# Patient Record
Sex: Male | Born: 1990 | Hispanic: Yes | Marital: Single | Smoking: Never smoker
Health system: Southern US, Community
[De-identification: ages and names within clinical notes are randomized; demographics above are authoritative.]

---

## 2013-02-15 ENCOUNTER — Emergency Department: Payer: Self-pay | Admitting: Emergency Medicine

## 2014-04-15 ENCOUNTER — Emergency Department: Payer: Self-pay | Admitting: Emergency Medicine

## 2019-04-18 ENCOUNTER — Emergency Department
Admission: EM | Admit: 2019-04-18 | Discharge: 2019-04-18 | Disposition: A | Discharge status: Home / Self Care | Payer: No Typology Code available for payment source | Attending: Emergency Medicine | Admitting: Emergency Medicine

## 2019-04-18 ENCOUNTER — Encounter: Payer: Self-pay | Admitting: Emergency Medicine

## 2019-04-18 ENCOUNTER — Other Ambulatory Visit: Payer: Self-pay

## 2019-04-18 ENCOUNTER — Emergency Department: Payer: No Typology Code available for payment source

## 2019-04-18 DIAGNOSIS — R1012 Left upper quadrant pain: Secondary | ICD-10-CM | POA: Insufficient documentation

## 2019-04-18 DIAGNOSIS — R0782 Intercostal pain: Secondary | ICD-10-CM | POA: Insufficient documentation

## 2019-04-18 DIAGNOSIS — M25512 Pain in left shoulder: Secondary | ICD-10-CM | POA: Insufficient documentation

## 2019-04-18 DIAGNOSIS — M542 Cervicalgia: Secondary | ICD-10-CM | POA: Insufficient documentation

## 2019-04-18 LAB — COMPREHENSIVE METABOLIC PANEL
ALT: 18 U/L (ref 0–44)
AST: 17 U/L (ref 15–41)
Albumin: 4.4 g/dL (ref 3.5–5.0)
Alkaline Phosphatase: 69 U/L (ref 38–126)
Anion gap: 10 (ref 5–15)
BUN: 12 mg/dL (ref 6–20)
CO2: 28 mmol/L (ref 22–32)
Calcium: 9.3 mg/dL (ref 8.9–10.3)
Chloride: 101 mmol/L (ref 98–111)
Creatinine, Ser: 0.82 mg/dL (ref 0.61–1.24)
GFR calc Af Amer: >60 mL/min (ref >60)
GFR calc non Af Amer: >60 mL/min (ref >60)
Glucose, Bld: 100 mg/dL — ABNORMAL HIGH (ref 70–99)
Potassium: 3.8 mmol/L (ref 3.5–5.1)
Sodium: 139 mmol/L (ref 135–145)
Total Bilirubin: 0.5 mg/dL (ref 0.3–1.2)
Total Protein: 7.4 g/dL (ref 6.5–8.1)

## 2019-04-18 LAB — URINALYSIS, COMPLETE (UACMP) WITH MICROSCOPIC
Bacteria, UA: NONE SEEN
Bilirubin Urine: NEGATIVE
Glucose, UA: NEGATIVE mg/dL
Hgb urine dipstick: NEGATIVE
Ketones, ur: NEGATIVE mg/dL
Leukocytes,Ua: NEGATIVE
Nitrite: NEGATIVE
Protein, ur: NEGATIVE mg/dL
Specific Gravity, Urine: 1.02 (ref 1.005–1.030)
Squamous Epithelial / HPF: NONE SEEN (ref 0–5)
pH: 6 (ref 5.0–8.0)

## 2019-04-18 LAB — CBC WITH DIFFERENTIAL/PLATELET
Abs Immature Granulocytes: 0.03 10*3/uL (ref 0.00–0.07)
Basophils Absolute: 0.1 10*3/uL (ref 0.0–0.1)
Basophils Relative: 1 %
Eosinophils Absolute: 0.1 10*3/uL (ref 0.0–0.5)
Eosinophils Relative: 1 %
HCT: 44.6 % (ref 39.0–52.0)
Hemoglobin: 15 g/dL (ref 13.0–17.0)
Immature Granulocytes: 0 %
Lymphocytes Relative: 21 %
Lymphs Abs: 2 10*3/uL (ref 0.7–4.0)
MCH: 28.9 pg (ref 26.0–34.0)
MCHC: 33.6 g/dL (ref 30.0–36.0)
MCV: 85.9 fL (ref 80.0–100.0)
Monocytes Absolute: 0.7 10*3/uL (ref 0.1–1.0)
Monocytes Relative: 8 %
Neutro Abs: 6.7 10*3/uL (ref 1.7–7.7)
Neutrophils Relative %: 69 %
Platelets: 291 10*3/uL (ref 150–400)
RBC: 5.19 MIL/uL (ref 4.22–5.81)
RDW: 13 % (ref 11.5–15.5)
WBC: 9.7 10*3/uL (ref 4.0–10.5)
nRBC: 0 % (ref 0.0–0.2)

## 2019-04-18 MED ORDER — IOHEXOL 300 MG/ML  SOLN
100.0000 mL | Freq: Once | INTRAMUSCULAR | Status: AC | PRN
  Administered 2019-04-18: 100 mL via INTRAVENOUS
  Filled 2019-04-18: qty 100

## 2019-04-18 MED ORDER — MELOXICAM 15 MG PO TABS: 15.0000 mg | ORAL_TABLET | Freq: Every day | ORAL | 1 refills | Status: DC

## 2019-04-18 MED ORDER — METHOCARBAMOL 500 MG PO TABS: 500.0000 mg | ORAL_TABLET | Freq: Three times a day (TID) | ORAL | 0 refills | Status: DC | PRN
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
## 2019-04-22 ENCOUNTER — Encounter
Admission: EM | Disposition: A | Discharge status: Home / Self Care | Payer: Self-pay | Source: Home / Self Care | Attending: Emergency Medicine

## 2019-04-22 ENCOUNTER — Observation Stay
Admission: EM | Admit: 2019-04-22 | Discharge: 2019-04-24 | Disposition: A | Discharge status: Home / Self Care | Attending: Surgery | Admitting: Surgery

## 2019-04-22 ENCOUNTER — Observation Stay: Admitting: Anesthesiology

## 2019-04-22 ENCOUNTER — Emergency Department

## 2019-04-22 ENCOUNTER — Encounter: Payer: Self-pay | Admitting: Radiology

## 2019-04-22 ENCOUNTER — Other Ambulatory Visit: Payer: Self-pay

## 2019-04-22 DIAGNOSIS — Z20828 Contact with and (suspected) exposure to other viral communicable diseases: Secondary | ICD-10-CM | POA: Insufficient documentation

## 2019-04-22 DIAGNOSIS — G4730 Sleep apnea, unspecified: Secondary | ICD-10-CM | POA: Diagnosis not present

## 2019-04-22 DIAGNOSIS — K3580 Unspecified acute appendicitis: Secondary | ICD-10-CM | POA: Diagnosis not present

## 2019-04-22 HISTORY — PX: LAPAROSCOPIC APPENDECTOMY: SHX408

## 2019-04-22 LAB — COMPREHENSIVE METABOLIC PANEL
ALT: 21 U/L (ref 0–44)
AST: 23 U/L (ref 15–41)
Albumin: 4.7 g/dL (ref 3.5–5.0)
Alkaline Phosphatase: 82 U/L (ref 38–126)
Anion gap: 13 (ref 5–15)
BUN: 11 mg/dL (ref 6–20)
CO2: 22 mmol/L (ref 22–32)
Calcium: 9.4 mg/dL (ref 8.9–10.3)
Chloride: 101 mmol/L (ref 98–111)
Creatinine, Ser: 0.82 mg/dL (ref 0.61–1.24)
GFR calc Af Amer: >60 mL/min (ref >60)
GFR calc non Af Amer: >60 mL/min (ref >60)
Glucose, Bld: 121 mg/dL — ABNORMAL HIGH (ref 70–99)
Potassium: 3.5 mmol/L (ref 3.5–5.1)
Sodium: 136 mmol/L (ref 135–145)
Total Bilirubin: 1.3 mg/dL — ABNORMAL HIGH (ref 0.3–1.2)
Total Protein: 7.8 g/dL (ref 6.5–8.1)

## 2019-04-22 LAB — SARS CORONAVIRUS 2 BY RT PCR (HOSPITAL ORDER, PERFORMED IN ~~LOC~~ HOSPITAL LAB): SARS Coronavirus 2: NEGATIVE

## 2019-04-22 LAB — CBC
HCT: 46 % (ref 39.0–52.0)
Hemoglobin: 15.5 g/dL (ref 13.0–17.0)
MCH: 28.5 pg (ref 26.0–34.0)
MCHC: 33.7 g/dL (ref 30.0–36.0)
MCV: 84.6 fL (ref 80.0–100.0)
Platelets: 292 10*3/uL (ref 150–400)
RBC: 5.44 MIL/uL (ref 4.22–5.81)
RDW: 12.9 % (ref 11.5–15.5)
WBC: 24 10*3/uL — ABNORMAL HIGH (ref 4.0–10.5)
nRBC: 0 % (ref 0.0–0.2)

## 2019-04-22 LAB — URINALYSIS, COMPLETE (UACMP) WITH MICROSCOPIC
Bacteria, UA: NONE SEEN
Bilirubin Urine: NEGATIVE
Glucose, UA: NEGATIVE mg/dL
Hgb urine dipstick: NEGATIVE
Ketones, ur: 5 mg/dL — AB
Leukocytes,Ua: NEGATIVE
Nitrite: NEGATIVE
Protein, ur: NEGATIVE mg/dL
Specific Gravity, Urine: 1.027 (ref 1.005–1.030)
Squamous Epithelial / HPF: NONE SEEN (ref 0–5)
pH: 7 (ref 5.0–8.0)

## 2019-04-22 LAB — LIPASE, BLOOD: Lipase: 20 U/L (ref 11–51)

## 2019-04-22 LAB — SURGICAL PCR SCREEN
MRSA, PCR: NEGATIVE
Staphylococcus aureus: NEGATIVE

## 2019-04-22 SURGERY — APPENDECTOMY, LAPAROSCOPIC
Anesthesia: General | Site: Abdomen

## 2019-04-22 MED ORDER — POLYETHYLENE GLYCOL 3350 17 G PO PACK: 17.0000 g | PACK | Freq: Every day | ORAL | Status: DC | PRN

## 2019-04-22 MED ORDER — ONDANSETRON HCL 4 MG/2ML IJ SOLN: 4.0000 mg | Freq: Four times a day (QID) | INTRAMUSCULAR | Status: DC | PRN

## 2019-04-22 MED ORDER — PROPOFOL 10 MG/ML IV BOLUS
INTRAVENOUS | Status: DC | PRN
  Administered 2019-04-22: 160 mg via INTRAVENOUS
  Administered 2019-04-22: 40 mg via INTRAVENOUS

## 2019-04-22 MED ORDER — MORPHINE SULFATE (PF) 4 MG/ML IV SOLN
4.0000 mg | Freq: Once | INTRAVENOUS | Status: AC
  Administered 2019-04-22: 4 mg via INTRAVENOUS
  Filled 2019-04-22: qty 1

## 2019-04-22 MED ORDER — HYDROMORPHONE HCL 1 MG/ML IJ SOLN
INTRAMUSCULAR | Status: DC | PRN
  Administered 2019-04-22 (×2): 0.5 mg via INTRAVENOUS

## 2019-04-22 MED ORDER — ACETAMINOPHEN 10 MG/ML IV SOLN
INTRAVENOUS | Status: AC
  Filled 2019-04-22: qty 100

## 2019-04-22 MED ORDER — ENOXAPARIN SODIUM 40 MG/0.4ML ~~LOC~~ SOLN
40.0000 mg | SUBCUTANEOUS | Status: DC
  Administered 2019-04-23: 40 mg via SUBCUTANEOUS
  Filled 2019-04-22: qty 0.4

## 2019-04-22 MED ORDER — KETOROLAC TROMETHAMINE 30 MG/ML IJ SOLN
30.0000 mg | Freq: Four times a day (QID) | INTRAMUSCULAR | Status: DC
  Administered 2019-04-22 – 2019-04-24 (×6): 30 mg via INTRAVENOUS
  Filled 2019-04-22 (×6): qty 1

## 2019-04-22 MED ORDER — OXYCODONE HCL 5 MG PO TABS: 5.0000 mg | ORAL_TABLET | Freq: Once | ORAL | Status: DC | PRN

## 2019-04-22 MED ORDER — LIDOCAINE HCL (CARDIAC) PF 100 MG/5ML IV SOSY
PREFILLED_SYRINGE | INTRAVENOUS | Status: DC | PRN
  Administered 2019-04-22: 100 mg via INTRAVENOUS

## 2019-04-22 MED ORDER — ONDANSETRON 4 MG PO TBDP
4.0000 mg | ORAL_TABLET | Freq: Once | ORAL | Status: AC | PRN
  Administered 2019-04-22: 4 mg via ORAL
  Filled 2019-04-22: qty 1

## 2019-04-22 MED ORDER — SODIUM CHLORIDE 0.9 % IV BOLUS
1000.0000 mL | Freq: Once | INTRAVENOUS | Status: AC
  Administered 2019-04-22: 1000 mL via INTRAVENOUS

## 2019-04-22 MED ORDER — IOHEXOL 300 MG/ML  SOLN
100.0000 mL | Freq: Once | INTRAMUSCULAR | Status: AC | PRN
  Administered 2019-04-22: 100 mL via INTRAVENOUS

## 2019-04-22 MED ORDER — PANTOPRAZOLE SODIUM 40 MG IV SOLR
40.0000 mg | Freq: Every day | INTRAVENOUS | Status: DC
  Administered 2019-04-22 – 2019-04-23 (×2): 40 mg via INTRAVENOUS
  Filled 2019-04-22 (×2): qty 40

## 2019-04-22 MED ORDER — ROCURONIUM BROMIDE 100 MG/10ML IV SOLN
INTRAVENOUS | Status: DC | PRN
  Administered 2019-04-22: 40 mg via INTRAVENOUS
  Administered 2019-04-22: 10 mg via INTRAVENOUS

## 2019-04-22 MED ORDER — OXYCODONE HCL 5 MG/5ML PO SOLN: 5.0000 mg | Freq: Once | ORAL | Status: DC | PRN

## 2019-04-22 MED ORDER — SUCCINYLCHOLINE CHLORIDE 20 MG/ML IJ SOLN
INTRAMUSCULAR | Status: DC | PRN
  Administered 2019-04-22: 100 mg via INTRAVENOUS

## 2019-04-22 MED ORDER — BUPIVACAINE-EPINEPHRINE (PF) 0.5% -1:200000 IJ SOLN
INTRAMUSCULAR | Status: AC
  Filled 2019-04-22: qty 30

## 2019-04-22 MED ORDER — SUGAMMADEX SODIUM 500 MG/5ML IV SOLN
INTRAVENOUS | Status: DC | PRN
  Administered 2019-04-22: 200 mg via INTRAVENOUS

## 2019-04-22 MED ORDER — MUPIROCIN 2 % EX OINT
1.0000 "application " | TOPICAL_OINTMENT | Freq: Two times a day (BID) | CUTANEOUS | Status: DC
  Filled 2019-04-22: qty 22

## 2019-04-22 MED ORDER — LACTATED RINGERS IV SOLN
INTRAVENOUS | Status: DC | PRN
  Administered 2019-04-22: 21:00:00 via INTRAVENOUS

## 2019-04-22 MED ORDER — FENTANYL CITRATE (PF) 100 MCG/2ML IJ SOLN
INTRAMUSCULAR | Status: AC
  Filled 2019-04-22: qty 2

## 2019-04-22 MED ORDER — HYDROMORPHONE HCL 1 MG/ML IJ SOLN: 0.5000 mg | INTRAMUSCULAR | Status: DC | PRN

## 2019-04-22 MED ORDER — FENTANYL CITRATE (PF) 100 MCG/2ML IJ SOLN
INTRAMUSCULAR | Status: DC | PRN
  Administered 2019-04-22: 100 ug via INTRAVENOUS

## 2019-04-22 MED ORDER — PROPOFOL 10 MG/ML IV BOLUS
INTRAVENOUS | Status: AC
  Filled 2019-04-22: qty 20

## 2019-04-22 MED ORDER — FENTANYL CITRATE (PF) 100 MCG/2ML IJ SOLN: 25.0000 ug | INTRAMUSCULAR | Status: DC | PRN

## 2019-04-22 MED ORDER — HYDROMORPHONE HCL 1 MG/ML IJ SOLN
INTRAMUSCULAR | Status: AC
  Filled 2019-04-22: qty 1

## 2019-04-22 MED ORDER — ONDANSETRON 4 MG PO TBDP: 4.0000 mg | ORAL_TABLET | Freq: Four times a day (QID) | ORAL | Status: DC | PRN

## 2019-04-22 MED ORDER — BUPIVACAINE-EPINEPHRINE (PF) 0.5% -1:200000 IJ SOLN
INTRAMUSCULAR | Status: DC | PRN
  Administered 2019-04-22: 30 mL

## 2019-04-22 MED ORDER — ONDANSETRON HCL 4 MG/2ML IJ SOLN
4.0000 mg | Freq: Once | INTRAMUSCULAR | Status: AC
  Administered 2019-04-22: 4 mg via INTRAVENOUS
  Filled 2019-04-22: qty 2

## 2019-04-22 MED ORDER — ACETAMINOPHEN 500 MG PO TABS: 1000.0000 mg | ORAL_TABLET | Freq: Four times a day (QID) | ORAL | Status: DC | PRN

## 2019-04-22 MED ORDER — LACTATED RINGERS IV SOLN
125.0000 mL/h | INTRAVENOUS | Status: DC
  Administered 2019-04-22 – 2019-04-23 (×3): 125 mL/h via INTRAVENOUS

## 2019-04-22 MED ORDER — ONDANSETRON HCL 4 MG/2ML IJ SOLN
INTRAMUSCULAR | Status: DC | PRN
  Administered 2019-04-22: 4 mg via INTRAVENOUS

## 2019-04-22 MED ORDER — ACETAMINOPHEN 10 MG/ML IV SOLN
INTRAVENOUS | Status: DC | PRN
  Administered 2019-04-22: 1000 mg via INTRAVENOUS

## 2019-04-22 MED ORDER — OXYCODONE HCL 5 MG PO TABS
5.0000 mg | ORAL_TABLET | ORAL | Status: DC | PRN
  Administered 2019-04-23: 5 mg via ORAL
  Filled 2019-04-22: qty 1

## 2019-04-22 MED ORDER — PIPERACILLIN-TAZOBACTAM 3.375 G IVPB
3.3750 g | Freq: Three times a day (TID) | INTRAVENOUS | Status: DC
  Administered 2019-04-22 – 2019-04-24 (×5): 3.375 g via INTRAVENOUS
  Filled 2019-04-22 (×5): qty 50

## 2019-04-22 MED ORDER — MIDAZOLAM HCL 2 MG/2ML IJ SOLN
INTRAMUSCULAR | Status: AC
  Filled 2019-04-22: qty 2

## 2019-04-22 SURGICAL SUPPLY — 45 items
CANISTER SUCT 1200ML W/VALVE (MISCELLANEOUS) ×3 IMPLANT
CHLORAPREP W/TINT 26 (MISCELLANEOUS) ×3 IMPLANT
COVER WAND RF STERILE (DRAPES) ×3 IMPLANT
CUTTER FLEX LINEAR 45M (STAPLE) ×2 IMPLANT
DERMABOND ADVANCED (GAUZE/BANDAGES/DRESSINGS) ×2
DERMABOND ADVANCED .7 DNX12 (GAUZE/BANDAGES/DRESSINGS) ×1 IMPLANT
ELECT CAUTERY BLADE 6.4 (BLADE) ×3 IMPLANT
ELECT REM PT RETURN 9FT ADLT (ELECTROSURGICAL) ×3
ELECTRODE REM PT RTRN 9FT ADLT (ELECTROSURGICAL) ×1 IMPLANT
GLOVE BIO SURGEON STRL SZ7 (GLOVE) ×4 IMPLANT
GLOVE BIO SURGEON STRL SZ7.5 (GLOVE) ×2 IMPLANT
GLOVE INDICATOR 7.5 STRL GRN (GLOVE) ×2 IMPLANT
GLOVE SURG SYN 7.0 (GLOVE) ×3 IMPLANT
GLOVE SURG SYN 7.0 PF PI (GLOVE) ×1 IMPLANT
GLOVE SURG SYN 7.5  E (GLOVE) ×2
GLOVE SURG SYN 7.5 E (GLOVE) ×1 IMPLANT
GLOVE SURG SYN 7.5 PF PI (GLOVE) ×1 IMPLANT
GOWN STRL REUS W/ TWL LRG LVL3 (GOWN DISPOSABLE) ×2 IMPLANT
GOWN STRL REUS W/TWL LRG LVL3 (GOWN DISPOSABLE) ×4
IRRIGATION STRYKERFLOW (MISCELLANEOUS) IMPLANT
IRRIGATOR STRYKERFLOW (MISCELLANEOUS) ×3
IV NS 1000ML (IV SOLUTION) ×2
IV NS 1000ML BAXH (IV SOLUTION) ×1 IMPLANT
KIT TURNOVER KIT A (KITS) ×3 IMPLANT
LABEL OR SOLS (LABEL) ×3 IMPLANT
LIGASURE LAP MARYLAND 5MM 37CM (ELECTROSURGICAL) ×2 IMPLANT
NEEDLE HYPO 22GX1.5 SAFETY (NEEDLE) ×3 IMPLANT
NS IRRIG 500ML POUR BTL (IV SOLUTION) ×3 IMPLANT
PACK LAP CHOLECYSTECTOMY (MISCELLANEOUS) ×3 IMPLANT
PENCIL ELECTRO HAND CTR (MISCELLANEOUS) ×3 IMPLANT
POUCH SPECIMEN RETRIEVAL 10MM (ENDOMECHANICALS) ×3 IMPLANT
RELOAD 45 VASCULAR/THIN (ENDOMECHANICALS) IMPLANT
RELOAD STAPLE 45 2.5 WHT GRN (ENDOMECHANICALS) IMPLANT
RELOAD STAPLE 45 3.5 BLU ETS (ENDOMECHANICALS) ×1 IMPLANT
RELOAD STAPLE TA45 3.5 REG BLU (ENDOMECHANICALS) ×6 IMPLANT
SCISSORS METZENBAUM CVD 33 (INSTRUMENTS) ×3 IMPLANT
SLEEVE ADV FIXATION 5X100MM (TROCAR) ×6 IMPLANT
SUT MNCRL 4-0 (SUTURE) ×2
SUT MNCRL 4-0 27XMFL (SUTURE) ×1
SUT VICRYL 0 AB UR-6 (SUTURE) ×3 IMPLANT
SUTURE MNCRL 4-0 27XMF (SUTURE) ×1 IMPLANT
TRAY FOLEY MTR SLVR 16FR STAT (SET/KITS/TRAYS/PACK) ×3 IMPLANT
TROCAR BALLN GELPORT 12X130M (ENDOMECHANICALS) ×3 IMPLANT
TROCAR Z-THREAD OPTICAL 5X100M (TROCAR) ×3 IMPLANT
TUBING EVAC SMOKE HEATED PNEUM (TUBING) ×3 IMPLANT

## 2019-04-22 NOTE — Op Note (Signed)
  Procedure Date:  04/22/2019  Pre-operative Diagnosis:  Acute appendicitis  Post-operative Diagnosis:  Acute appendicitis  Procedure:  Laparoscopic appendectomy  Surgeon:  Melvyn Neth, MD  Anesthesia:  General endotracheal  Estimated Blood Loss:  15 ml  Specimens:  appendix  Complications:  None  Indications for Procedure:  This is a 28 y.o. male who presents with abdominal pain and workup revealing acute appendicitis.  The options of surgery versus observation were reviewed with the patient and/or family. The risks of bleeding, infection, recurrence of symptoms, negative laparoscopy, potential for an open procedure, bowel injury, abscess or infection, were all discussed with the patient and he was willing to proceed.  Description of Procedure: The patient was correctly identified in the preoperative area and brought into the operating room.  The patient was placed supine with VTE prophylaxis in place.  Appropriate time-outs were performed.  Anesthesia was induced and the patient was intubated.  Foley catheter was placed.  Appropriate antibiotics were infused.  The abdomen was prepped and draped in a sterile fashion. An infraumbilical incision was made. A cutdown technique was used to enter the abdominal cavity without injury, and a Hasson trocar was inserted.  Pneumoperitoneum was obtained with appropriate opening pressures.  Two 5-mm ports were placed in the suprapubic and left lateral positions under direct visualization.  The right lower quadrant was inspected and the appendix was identified and found to be acutely inflamed.  The appendix was carefully dissected.  The mesoappendix was divided using the LigaSure.  The base of the appendix was dissected out and divided with a standard load Endo GIA.  The appendix was placed in an Endocatch bag.  There was oozing from the staple line which was controlled with cautery.  The right lower quadrant was then inspected again revealing an  intact staple line, no bleeding, and no bowel injury.  The area was thoroughly irrigated.  The 5 mm ports were removed under direct visualization and the Hasson trocar was removed.  The Endocatch bag was brought out through the umbilical incision.  The fascial opening was closed using 0 vicryl suture.  Local anesthetic was infused in all incisions and the incisions were closed with 4-0 Monocryl.  The wounds were cleaned and sealed with DermaBond.  Foley catheter was removed and the patient was emerged from anesthesia and extubated and brought to the recovery room for further management.  The patient tolerated the procedure well and all counts were correct at the end of the case.   Melvyn Neth, MD

## 2019-04-22 NOTE — Transfer of Care (Signed)
Immediate Anesthesia Transfer of Care Note  Patient: Pace Lamadrid  Procedure(s) Performed: APPENDECTOMY LAPAROSCOPIC (N/A Abdomen)  Patient Location: PACU  Anesthesia Type:General  Level of Consciousness: awake, alert  and oriented  Airway & Oxygen Therapy: Patient Spontanous Breathing  Post-op Assessment: Report given to RN  Post vital signs: Reviewed and stable  Last Vitals:  Vitals Value Taken Time  BP 139/87 04/22/19 2234  Temp 37 C 04/22/19 2234  Pulse 116 04/22/19 2234  Resp 20 04/22/19 2234  SpO2 99 % 04/22/19 2234  Vitals shown include unvalidated device data.  Last Pain:  Vitals:   04/22/19 2005  TempSrc: Oral  PainSc:          Complications: No apparent anesthesia complications
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
## 2019-04-22 NOTE — Anesthesia Post-op Follow-up Note (Signed)
Anesthesia QCDR form completed.        

## 2019-04-22 NOTE — Anesthesia Preprocedure Evaluation (Signed)
Anesthesia Evaluation  Patient identified by MRN, date of birth, ID band Patient awake    Reviewed: Allergy & Precautions, H&P , NPO status , Patient's Chart, lab work & pertinent test results  History of Anesthesia Complications Negative for: history of anesthetic complications  Airway Mallampati: III  TM Distance: <3 FB Neck ROM: full    Dental  (+) Chipped, Poor Dentition   Pulmonary neg shortness of breath, sleep apnea ,           Cardiovascular (-) angina(-) Past MI and (-) DOE negative cardio ROS       Neuro/Psych negative neurological ROS  negative psych ROS   GI/Hepatic negative GI ROS, Neg liver ROS, neg GERD  ,  Endo/Other  negative endocrine ROS  Renal/GU      Musculoskeletal   Abdominal   Peds  Hematology negative hematology ROS (+)   Anesthesia Other Findings  BMI    Body Mass Index: 27.41 kg/m      Reproductive/Obstetrics negative OB ROS                             Anesthesia Physical Anesthesia Plan  ASA: III  Anesthesia Plan: General ETT   Post-op Pain Management:    Induction: Intravenous  PONV Risk Score and Plan: Ondansetron, Dexamethasone, Midazolam and Treatment may vary due to age or medical condition  Airway Management Planned: Oral ETT  Additional Equipment:   Intra-op Plan:   Post-operative Plan: Extubation in OR  Informed Consent: I have reviewed the patients History and Physical, chart, labs and discussed the procedure including the risks, benefits and alternatives for the proposed anesthesia with the patient or authorized representative who has indicated his/her understanding and acceptance.     Dental Advisory Given  Plan Discussed with: Anesthesiologist, CRNA and Surgeon  Anesthesia Plan Comments: (Patient consented for risks of anesthesia including but not limited to:  - adverse reactions to medications - damage to teeth, lips or  other oral mucosa - sore throat or hoarseness - Damage to heart, brain, lungs or loss of life  Patient voiced understanding.)        Anesthesia Quick Evaluation

## 2019-04-22 NOTE — ED Triage Notes (Signed)
Pt reports abd pain that started at noon today with vomiting, pt has bilat lower abd pain and reports pain is a little worse at the rlq,.

## 2019-04-22 NOTE — Anesthesia Procedure Notes (Signed)
Procedure Name: Intubation Performed by: Andria Frames, MD Pre-anesthesia Checklist: Timeout performed, Patient being monitored, Suction available, Emergency Drugs available and Patient identified Patient Re-evaluated:Patient Re-evaluated prior to induction Oxygen Delivery Method: Circle system utilized Preoxygenation: Pre-oxygenation with 100% oxygen Induction Type: IV induction and Rapid sequence Laryngoscope Size: Mac and 3 Grade View: Grade I Tube type: Oral Tube size: 7.5 mm Number of attempts: 1 Placement Confirmation: breath sounds checked- equal and bilateral,  CO2 detector,  positive ETCO2 and ETT inserted through vocal cords under direct vision Secured at: 23 cm Tube secured with: Tape

## 2019-04-23 ENCOUNTER — Encounter: Payer: Self-pay | Admitting: Surgery

## 2019-04-23 LAB — CBC
HCT: 39.6 % (ref 39.0–52.0)
Hemoglobin: 13.1 g/dL (ref 13.0–17.0)
MCH: 28.5 pg (ref 26.0–34.0)
MCHC: 33.1 g/dL (ref 30.0–36.0)
MCV: 86.3 fL (ref 80.0–100.0)
Platelets: 253 10*3/uL (ref 150–400)
RBC: 4.59 MIL/uL (ref 4.22–5.81)
RDW: 13 % (ref 11.5–15.5)
WBC: 20.4 10*3/uL — ABNORMAL HIGH (ref 4.0–10.5)
nRBC: 0 % (ref 0.0–0.2)

## 2019-04-23 MED ORDER — SODIUM CHLORIDE 0.9 % IV SOLN
INTRAVENOUS | Status: DC | PRN
  Administered 2019-04-23: 250 mL via INTRAVENOUS

## 2019-04-23 NOTE — Plan of Care (Signed)
  Problem: Clinical Measurements: Goal: Postoperative complications will be avoided or minimized Outcome: Progressing   

## 2019-04-23 NOTE — Progress Notes (Addendum)
04/23/2019  Subjective: Patient is 1 Day Post-Op status post laparoscopic appendectomy.  No acute events overnight.  Patient reports that his pain is significantly better.  He has been able to tolerate clears and his diet has been advanced to a soft diet this morning.  Denies any nausea.  His white blood cell count did decrease somewhat but only to20.4.  Vital signs: Temp:  [98 F (36.7 C)-98.9 F (37.2 C)] 98.3 F (36.8 C) (09/26 0917) Pulse Rate:  [68-122] 68 (09/26 0917) Resp:  [11-22] 20 (09/26 0438) BP: (102-139)/(56-87) 117/72 (09/26 0917) SpO2:  [96 %-100 %] 100 % (09/26 0917) Weight:  [79.4 kg] 79.4 kg (09/25 1616)   Intake/Output: 09/25 0701 - 09/26 0700 In: 2503.1 [P.O.:120; I.V.:1375.7; IV Piggyback:1007.4] Out: 50 [Blood:50] Last BM Date: 04/22/19  Physical Exam: Constitutional: No acute distress Abdomen: Soft, nondistended, appropriately tender to palpation.  Incisions are clean dry and intact with no evidence of infection.  Labs:  Recent Labs    04/22/19 1618 04/23/19 0440  WBC 24.0* 20.4*  HGB 15.5 13.1  HCT 46.0 39.6  PLT 292 253   Recent Labs    04/22/19 1618  NA 136  K 3.5  CL 101  CO2 22  GLUCOSE 121*  BUN 11  CREATININE 0.82  CALCIUM 9.4   No results for input(s): LABPROT, INR in the last 72 hours.  Imaging: Ct Abdomen Pelvis W Contrast  Result Date: 04/22/2019 CLINICAL DATA:  Right lower quadrant pain EXAM: CT ABDOMEN AND PELVIS WITH CONTRAST TECHNIQUE: Multidetector CT imaging of the abdomen and pelvis was performed using the standard protocol following bolus administration of intravenous contrast. CONTRAST:  114mL OMNIPAQUE IOHEXOL 300 MG/ML  SOLN COMPARISON:  04/18/2019 FINDINGS: Lower chest: Lung bases are clear. No effusions. Heart is normal size. Hepatobiliary: No focal hepatic abnormality. Gallbladder unremarkable. Pancreas: No focal abnormality or ductal dilatation. Spleen: No focal abnormality.  Normal size. Adrenals/Urinary Tract: No  adrenal abnormality. No focal renal abnormality. No stones or hydronephrosis. Urinary bladder is unremarkable. Stomach/Bowel: Stomach, large and small bowel grossly unremarkable. Appendix is dilated measuring up to 10 mm in diameter with surrounding inflammation. Findings compatible with acute appendicitis. Vascular/Lymphatic: No evidence of aneurysm or adenopathy. Reproductive: No visible focal abnormality. Other: No free fluid or free air. Musculoskeletal: No acute bony abnormality. IMPRESSION: Changes of acute appendicitis.  No evidence of perforation. These results were called by telephone at the time of interpretation on 04/22/2019 at 6:09 pm to provider DR. PHILLIP STAFFORD , who verbally acknowledged these results. Electronically Signed   By: Rolm Baptise M.D.   On: 04/22/2019 18:13    Assessment/Plan: This is a 28 y.o. male s/p laparoscopic appendectomy.  - Patient's white blood cell count is still elevated at 20.4 today.  Discussed with the patient that in light of this, I would like to keep him here for 1 more day in order to allow for IV antibiotics for 24 more hours.  Will repeat CBC tomorrow and pending that his white blood cell count continues to improve, we should be able to discharge him home tomorrow. -Otherwise continue soft diet, will DC IV fluids, continue pain medications and ambulation.   Melvyn Neth, [facility]

## 2019-04-23 NOTE — Anesthesia Postprocedure Evaluation (Signed)
Anesthesia Post Note  Patient: Troy Bennett  Procedure(s) Performed: APPENDECTOMY LAPAROSCOPIC (N/A Abdomen)  Patient location during evaluation: PACU Anesthesia Type: General Level of consciousness: awake and alert Pain management: pain level controlled Vital Signs Assessment: post-procedure vital signs reviewed and stable Respiratory status: spontaneous breathing, nonlabored ventilation, respiratory function stable and patient connected to nasal cannula oxygen Cardiovascular status: blood pressure returned to baseline and stable Postop Assessment: no apparent nausea or vomiting Anesthetic complications: no     Last Vitals:  Vitals:   04/22/19 2336 04/23/19 0006  BP: 113/69 (!) 108/59  Pulse: 95 98  Resp: 18 18  Temp: 36.7 C 36.9 C  SpO2: 99% 97%    Last Pain:  Vitals:   04/23/19 0006  TempSrc: Oral  PainSc:                  Troy Bennett

## 2019-04-23 NOTE — Plan of Care (Signed)
  Problem: Clinical Measurements: Goal: Postoperative complications will be avoided or minimized Outcome: Progressing   Problem: Education: Goal: Knowledge of General Education information will improve Description: Including pain rating scale, medication(s)/side effects and non-pharmacologic comfort measures Outcome: Progressing

## 2019-04-24 LAB — CBC
HCT: 40.2 % (ref 39.0–52.0)
Hemoglobin: 13.1 g/dL (ref 13.0–17.0)
MCH: 28.6 pg (ref 26.0–34.0)
MCHC: 32.6 g/dL (ref 30.0–36.0)
MCV: 87.8 fL (ref 80.0–100.0)
Platelets: 257 10*3/uL (ref 150–400)
RBC: 4.58 MIL/uL (ref 4.22–5.81)
RDW: 13.4 % (ref 11.5–15.5)
WBC: 9.5 10*3/uL (ref 4.0–10.5)
nRBC: 0 % (ref 0.0–0.2)

## 2019-04-24 LAB — HIV ANTIBODY (ROUTINE TESTING W REFLEX): HIV Screen 4th Generation wRfx: NONREACTIVE

## 2019-04-24 MED ORDER — AMOXICILLIN-POT CLAVULANATE 875-125 MG PO TABS: 1.0000 | ORAL_TABLET | Freq: Two times a day (BID) | ORAL | 0 refills | Status: AC

## 2019-04-24 MED ORDER — IBUPROFEN 600 MG PO TABS: 600.0000 mg | ORAL_TABLET | Freq: Three times a day (TID) | ORAL | 0 refills | Status: DC | PRN

## 2019-04-24 MED ORDER — OXYCODONE HCL 5 MG PO TABS: 5.0000 mg | ORAL_TABLET | ORAL | 0 refills | Status: AC | PRN

## 2019-04-24 NOTE — Progress Notes (Signed)
Discharge order received. Patient mental status is at baseline. Vital signs stable . No signs of acute distress. Discharge instructions given. Patient verbalized understanding. No other issues noted at this time.   

## 2019-04-24 NOTE — Discharge Summary (Addendum)
Patient ID: Troy Bennett MRN: [id number] DOB/AGE: 1991-07-04 28 y.o.  Admit date: 04/22/2019 Discharge date: 04/24/2019   Discharge Diagnoses:  Active Problems:   Acute appendicitis   Procedures:  Laparoscopic appendectomy  Hospital Course: Patient was admitted on 04/22/2019 with acute appendicitis.  He was taken to the operating room on the same day for laparoscopic appendectomy.  On admission his white blood cell count was very elevated at 24.  On postop day #1 his white blood cell count had only come down to 20.  As such we kept him 1 more day for IV antibiotics.  Today his white blood cell count has normalized.  Patient is doing well tolerating a diet, ambulating, with pain well controlled.  Abdomen soft, nondistended, appropriately tender to palpation.  Incisions are clean dry and intact.  Patient will be discharged home today.  Consults: None  Disposition: Discharge disposition: 01-Home or Self Care       Discharge Instructions    Call MD for:  difficulty breathing, headache or visual disturbances   Complete by: As directed    Call MD for:  persistant nausea and vomiting   Complete by: As directed    Call MD for:  redness, tenderness, or signs of infection (pain, swelling, redness, odor or green/yellow discharge around incision site)   Complete by: As directed    Call MD for:  severe uncontrolled pain   Complete by: As directed    Call MD for:  temperature >100.4   Complete by: As directed    Diet - low sodium heart healthy   Complete by: As directed    Discharge instructions   Complete by: As directed    1.  Patient may shower, but do not scrub wounds heavily and dab dry only. 2.  Do not submerge wounds in pool/tub for 1 week. 3.  Do not apply ointments or hydrogen peroxide to the wounds. 4.  Continue antibiotics until course completed.   Driving Restrictions   Complete by: As directed    Do not drive while taking narcotics for pain control.   Increase activity  slowly   Complete by: As directed    Lifting restrictions   Complete by: As directed    No heavy lifting or pushing of more than 10-15 lbs for 4 weeks.   No dressing needed   Complete by: As directed      Allergies as of 04/24/2019   No Known Allergies     Medication List    STOP taking these medications   meloxicam 15 MG tablet Commonly known as: MOBIC   methocarbamol 500 MG tablet Commonly known as: Robaxin     TAKE these medications   amoxicillin-clavulanate 875-125 MG tablet Commonly known as: Augmentin Take 1 tablet by mouth 2 (two) times daily for 10 days.   ibuprofen 600 MG tablet Commonly known as: ADVIL Take 1 tablet (600 mg total) by mouth every 8 (eight) hours as needed for fever, mild pain or moderate pain.   oxyCODONE 5 MG immediate release tablet Commonly known as: Oxy IR/ROXICODONE Take 1 tablet (5 mg total) by mouth every 4 (four) hours as needed for severe pain.      Follow-up Information    Tylene Fantasia, PA-C Follow up in 2 week(s).   Specialty: Physician Assistant Contact information: [facility] [place] [address] [phone]

## 2019-04-26 LAB — SURGICAL PATHOLOGY

## 2019-05-11 ENCOUNTER — Encounter: Payer: Self-pay | Admitting: Physician Assistant

## 2019-05-11 ENCOUNTER — Other Ambulatory Visit: Payer: Self-pay

## 2019-05-11 ENCOUNTER — Ambulatory Visit (INDEPENDENT_AMBULATORY_CARE_PROVIDER_SITE_OTHER): Admitting: Physician Assistant

## 2019-05-11 VITALS — BP 127/84 | HR 68 | Temp 97.5°F | Resp 12 | Wt 174.0 lb

## 2019-05-11 DIAGNOSIS — Z09 Encounter for follow-up examination after completed treatment for conditions other than malignant neoplasm: Secondary | ICD-10-CM

## 2019-05-11 DIAGNOSIS — K3580 Unspecified acute appendicitis: Secondary | ICD-10-CM

## 2019-05-11 NOTE — Progress Notes (Signed)
[facility] POST-OP OFFICE VISIT  05/11/2019  HPI: Troy Bennett is a 28 y.o. male 19 days s/p laparoscopic appendectomy with Dr Hampton Abbot.  Today, he reports that he is doing well. No issues with fever, chills, nausea, emesis, PO intake, or bowel movements. No pain. Incisions are healing well. No other complaints.   Vital signs: BP 127/84   Pulse 68   Temp (!) 97.5 F (36.4 C)   Resp 12   Wt 174 lb (78.9 kg)   SpO2 97%   BMI 27.25 kg/m    Physical Exam: Constitutional: Well appearing male, NAD Abdomen: Soft, non-tender, non-distended, no rebound/guarding Skin: Laparoscopic incisions are CDI without erythema or drainage  Assessment/Plan: This is a 28 y.o. male 19 days s/p laparoscopic appendectomy.    - pain control prn  - okay to shower/submerge wounds  - complete 2 more weeks of lifting restrictions; letter for work provided  - Reviewed pathology: acute suppurative appendicitis  - rtc prn  -- Edison Simon, PA-C [facility] 05/11/2019, 9:12 AM [phone] M-F: 7am - 4pm

## 2019-05-11 NOTE — Patient Instructions (Addendum)
No heavy lifting for 2 more weeks around 05/25/19, gradually increase activity after the 2 weeks, okay to take a shower. May take tylenol or ibuprofen for pain, okay to go back to work with the follow restrictions.  Follow up as needed   Laparoscopic Appendectomy, Adult, Care After This sheet gives you information about how to care for yourself after your procedure. Your health care provider may also give you more specific instructions. If you have problems or questions, contact your health care provider. What can I expect after the procedure? After the procedure, it is common to have:  Little energy for normal activities.  Mild pain in the area where the incisions were made.  Difficulty passing stool (constipation). This can be caused by: ? Pain medicine. ? A decrease in your activity. Follow these instructions at home: Medicines  Take over-the-counter and prescription medicines only as told by your health care provider.  If you were prescribed an antibiotic medicine, take it as told by your health care provider. Do not stop taking the antibiotic even if you start to feel better.  Do not drive or use heavy machinery while taking prescription pain medicine.  Ask your health care provider if the medicine prescribed to you can cause constipation. You may need to take steps to prevent or treat constipation, such as: ? Drink enough fluid to keep your urine pale yellow. ? Take over-the-counter or prescription medicines. ? Eat foods that are high in fiber, such as beans, whole grains, and fresh fruits and vegetables. ? Limit foods that are high in fat and processed sugars, such as fried or sweet foods. Incision care   Follow instructions from your health care provider about how to take care of your incisions. Make sure you: ? Wash your hands with soap and water before and after you change your bandage (dressing). If soap and water are not available, use hand sanitizer. ? Change your  dressing as told by your health care provider. ? Leave stitches (sutures), skin glue, or adhesive strips in place. These skin closures may need to stay in place for 2 weeks or longer. If adhesive strip edges start to loosen and curl up, you may trim the loose edges. Do not remove adhesive strips completely unless your health care provider tells you to do that.  Check your incision areas every day for signs of infection. Check for: ? Redness, swelling, or pain. ? Fluid or blood. ? Warmth. ? Pus or a bad smell. Bathing  Keep your incisions clean and dry. Clean them as often as told by your health care provider. To do this: 1. Gently wash the incisions with soap and water. 2. Rinse the incisions with water to remove all soap. 3. Pat the incisions dry with a clean towel. Do not rub the incisions.  Do not take baths, swim, or use a hot tub for 2 weeks, or until your health care provider approves. You may take showers after 48 hours. Activity   Do not drive for 24 hours if you were given a sedative during your procedure.  Rest after the procedure. Return to your normal activities as told by your health care provider. Ask your health care provider what activities are safe for you.  For 3 weeks, or for as long as told by your health care provider: ? Do not lift anything that is heavier than 10 lb (4.5 kg), or the limit that you are told. ? Do not play contact sports. General instructions  If  you were sent home with a drain, follow instructions from your health care provider about how to care for it.  Take deep breaths. This helps to prevent your lungs from developing an infection (pneumonia).  Keep all follow-up visits as told by your health care provider. This is important. Contact a health care provider if:  You have redness, swelling, or pain around an incision.  You have fluid or blood coming from an incision.  Your incision feels warm to the touch.  You have pus or a bad smell  coming from an incision or dressing.  Your incision edges break open after your sutures have been removed.  You have increasing pain in your shoulders.  You feel dizzy or you faint.  You develop shortness of breath.  You keep feeling nauseous or you are vomiting.  You have diarrhea or you cannot control your bowel functions.  You lose your appetite.  You develop swelling or pain in your legs.  You develop a rash. Get help right away if you have:  A fever.  Difficulty breathing.  Sharp pains in your chest. Summary  After a laparoscopic appendectomy, it is common to have little energy for normal activities, mild pain in the area of the incisions, and constipation.  Infection is the most common complication after this procedure. Follow your health care provider's instructions about caring for yourself after the procedure.  Rest after the procedure. Return to your normal activities as told by your health care provider.  Contact your health care provider if you notice signs of infection around your incisions or you develop shortness of breath. Get help right away if you have a fever, chest pain, or difficulty breathing. This information is not intended to replace advice given to you by your health care provider. Make sure you discuss any questions you have with your health care provider. Document Released: 07/14/2005 Document Revised: 01/14/2018 Document Reviewed: 01/14/2018 Elsevier Patient Education  2020 [employer].

## 2020-04-04 ENCOUNTER — Ambulatory Visit: Payer: No Typology Code available for payment source | Attending: Neurology

## 2020-04-04 DIAGNOSIS — G4730 Sleep apnea, unspecified: Secondary | ICD-10-CM | POA: Insufficient documentation

## 2020-04-05 ENCOUNTER — Other Ambulatory Visit: Payer: Self-pay

## 2021-05-22 IMAGING — CT CT ABD-PELV W/ CM
2 of 4 series · 16 of 46 positions shown, 18 images · IV contrast (APPLIED)
Comparison: 04/18/2019

CLINICAL DATA: Right lower quadrant pain

EXAM:
CT ABDOMEN AND PELVIS WITH CONTRAST
TECHNIQUE: Multidetector CT imaging of the abdomen and pelvis was performed
using the standard protocol following bolus administration of
intravenous contrast.
CONTRAST:  100mL OMNIPAQUE IOHEXOL 300 MG/ML  SOLN

[Series 2: routine abd/pel with · axial · 0.71mm/px · z∈[-655,-205]mm · 13 of 98 slices shown, 15 images]
[im 4/98  soft-tissue]
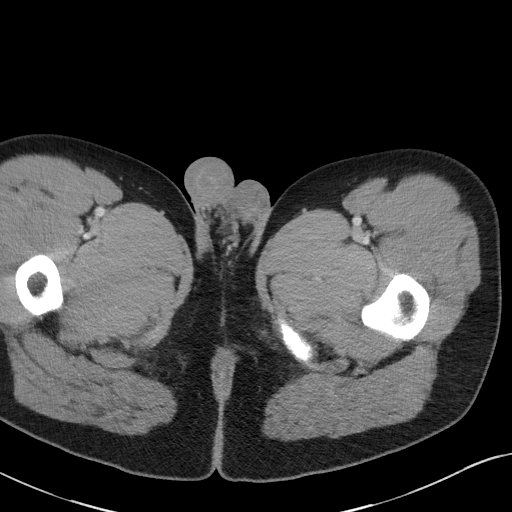
[im 4/98  bone]
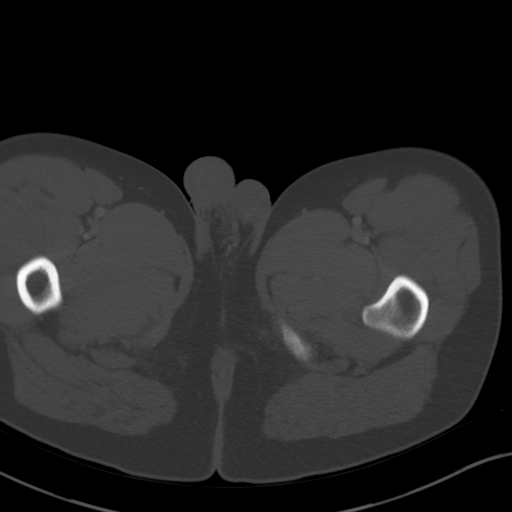
[im 12/98  soft-tissue]
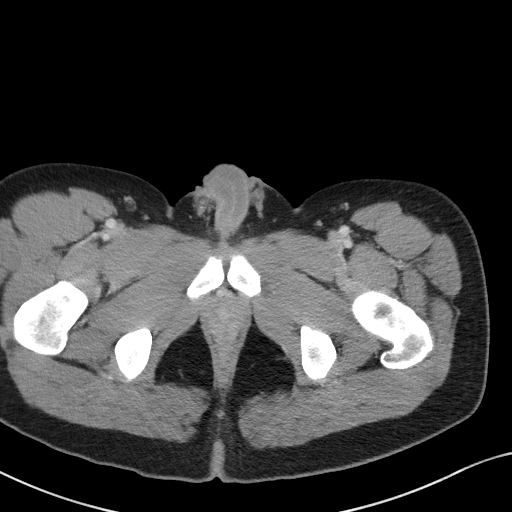
[im 20/98  soft-tissue]
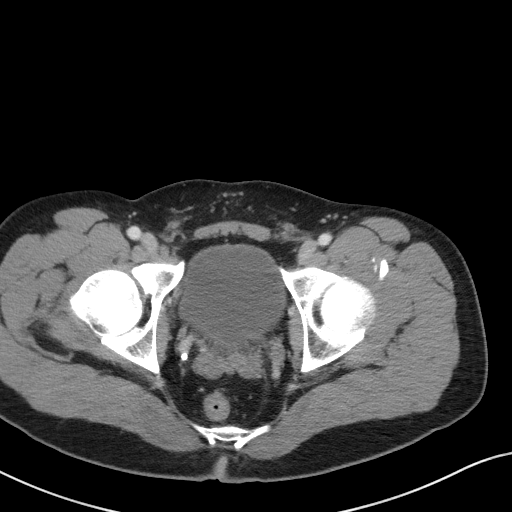
[im 28/98  soft-tissue]
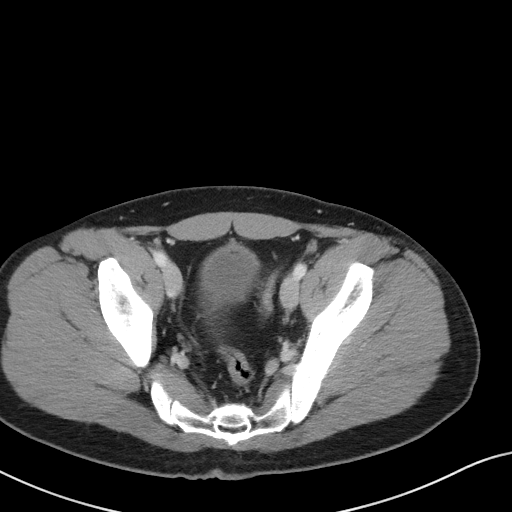
[im 35/98  soft-tissue]
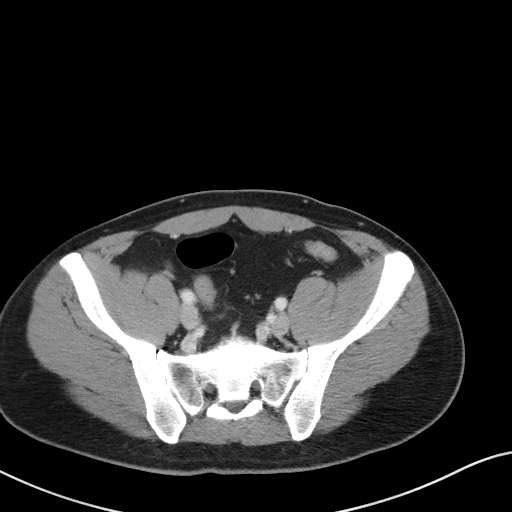
[im 43/98  soft-tissue]
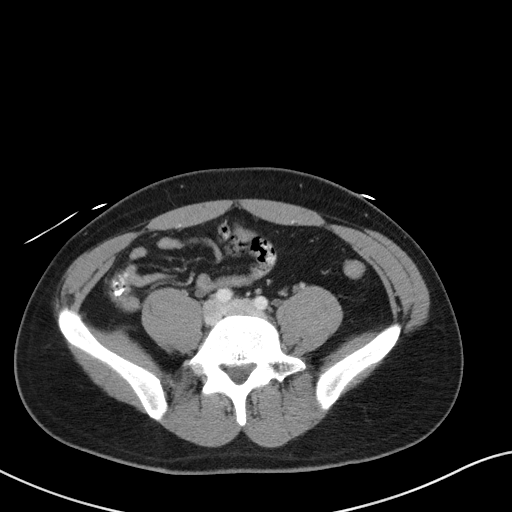
[im 51/98  soft-tissue]
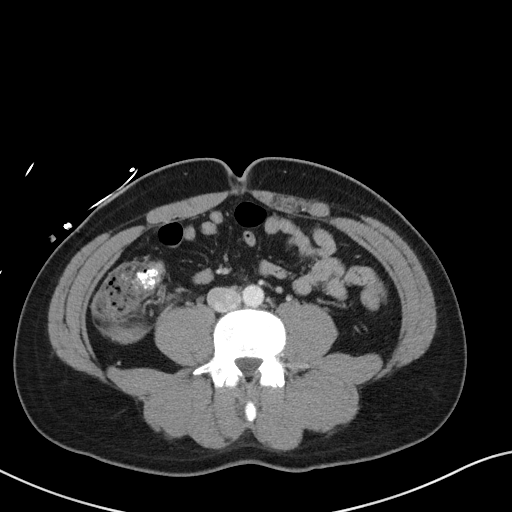
[im 55/98  soft-tissue]
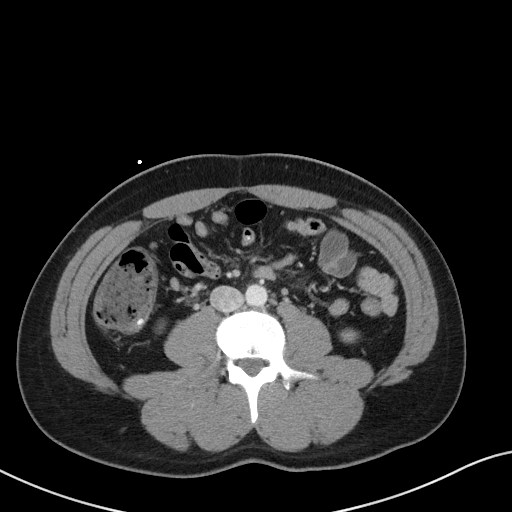
[im 63/98  soft-tissue]
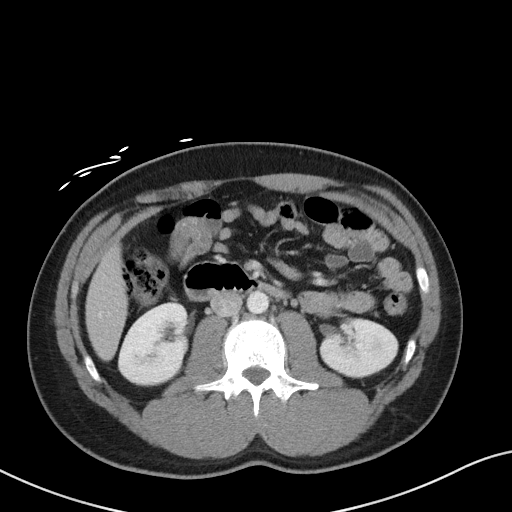
[im 63/98  bone]
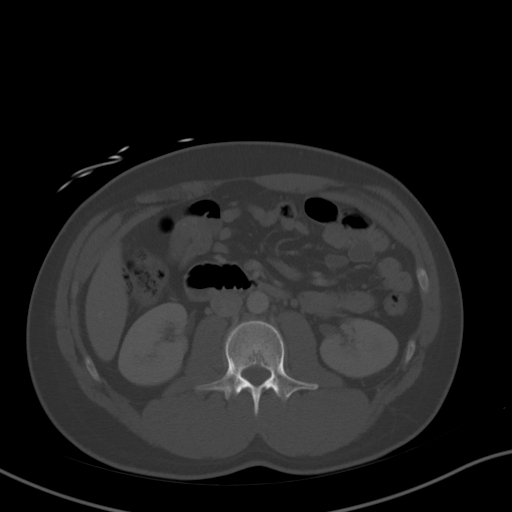
[im 70/98  soft-tissue]
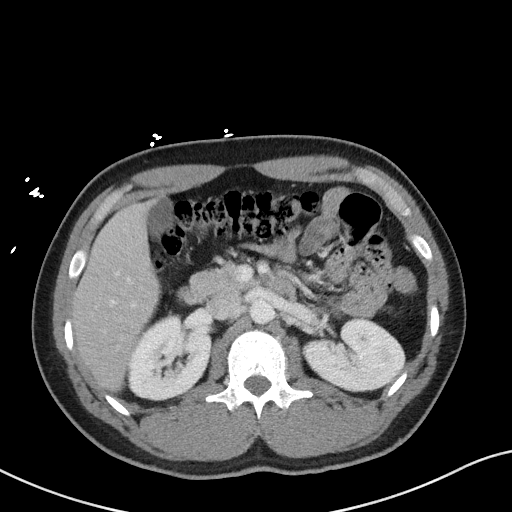
[im 78/98  soft-tissue]
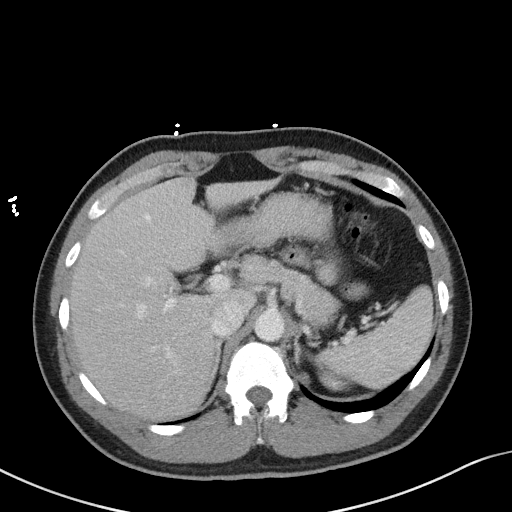
[im 86/98  soft-tissue]
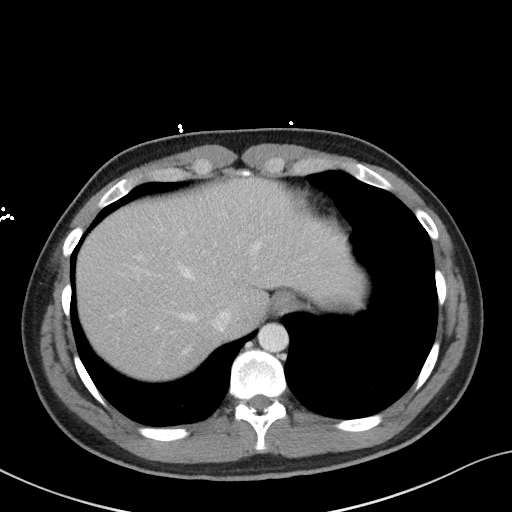
[im 94/98  soft-tissue]
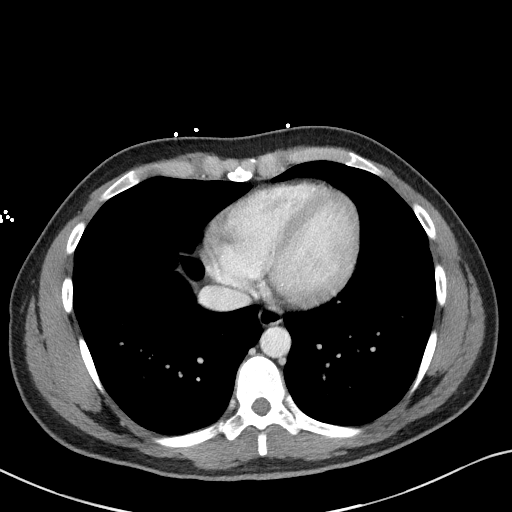

[Series 5: coronal st · coronal · 0.75mm/px · 3 of 87 slices shown]
[im 29/87  soft-tissue]
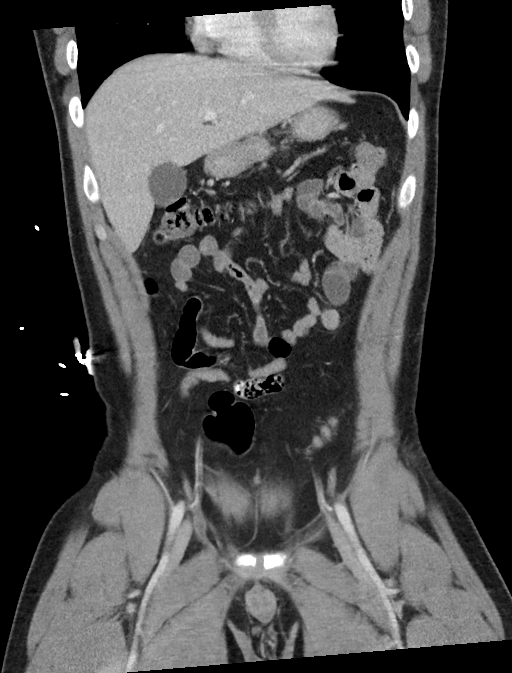
[im 39/87  soft-tissue]
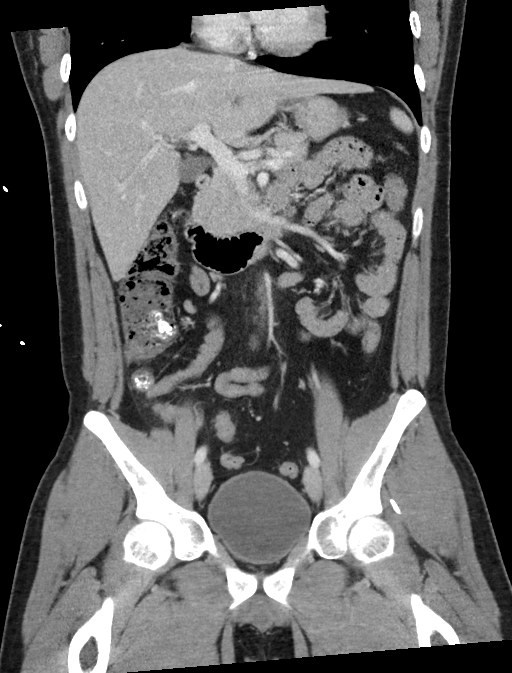
[im 48/87  soft-tissue]
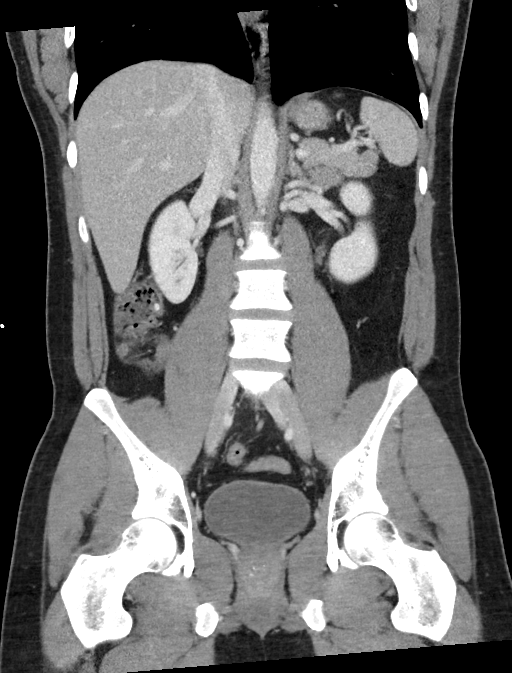

[16 of 46 positions shown; findings below may reference images not displayed]

FINDINGS: Lower chest: Lung bases are clear. No effusions. Heart is normal
size.

Hepatobiliary: No focal hepatic abnormality. Gallbladder
unremarkable.

Pancreas: No focal abnormality or ductal dilatation.

Spleen: No focal abnormality.  Normal size.

Adrenals/Urinary Tract: No adrenal abnormality. No focal renal
abnormality. No stones or hydronephrosis. Urinary bladder is
unremarkable.

Stomach/Bowel: Stomach, large and small bowel grossly unremarkable.
Appendix is dilated measuring up to 10 mm in diameter with
surrounding inflammation. Findings compatible with acute
appendicitis.

Vascular/Lymphatic: No evidence of aneurysm or adenopathy.

Reproductive: No visible focal abnormality.

Other: No free fluid or free air.

Musculoskeletal: No acute bony abnormality.
IMPRESSION: Changes of acute appendicitis.  No evidence of perforation.

These results were called by telephone at the time of interpretation
on 04/22/2019 at [DATE] to provider DR. LOGISTIKA SAULACIC LOMPAR , who
verbally acknowledged these results.

## 2022-06-30 ENCOUNTER — Emergency Department
Admission: EM | Admit: 2022-06-30 | Discharge: 2022-06-30 | Discharge status: Against Medical Advice | Payer: No Typology Code available for payment source | Attending: Emergency Medicine | Admitting: Emergency Medicine

## 2022-06-30 ENCOUNTER — Other Ambulatory Visit: Payer: Self-pay

## 2022-06-30 DIAGNOSIS — R0781 Pleurodynia: Secondary | ICD-10-CM | POA: Insufficient documentation

## 2022-06-30 DIAGNOSIS — Z5321 Procedure and treatment not carried out due to patient leaving prior to being seen by health care provider: Secondary | ICD-10-CM | POA: Insufficient documentation

## 2022-06-30 NOTE — ED Triage Notes (Signed)
C/O left rib pain.  STates felt something 'pull' when straining during a bowel movement.  Denies other injury.  AAOX3.  Skin warm and dry. NAD
